# Patient Record
Sex: Male | Born: 2005 | Hispanic: No | Marital: Single
Health system: Southern US, Community
[De-identification: ages and names within clinical notes are randomized; demographics above are authoritative.]

## PROBLEM LIST (undated history)

## (undated) DIAGNOSIS — F840 Autistic disorder: Secondary | ICD-10-CM

## (undated) DIAGNOSIS — R569 Unspecified convulsions: Secondary | ICD-10-CM

## (undated) DIAGNOSIS — Q8789 Other specified congenital malformation syndromes, not elsewhere classified: Secondary | ICD-10-CM

## (undated) DIAGNOSIS — H02409 Unspecified ptosis of unspecified eyelid: Secondary | ICD-10-CM

## (undated) DIAGNOSIS — R011 Cardiac murmur, unspecified: Secondary | ICD-10-CM

## (undated) DIAGNOSIS — M9112 Juvenile osteochondrosis of head of femur [Legg-Calve-Perthes], left leg: Secondary | ICD-10-CM

## (undated) HISTORY — PX: LOBECTOMY: SHX5089

## (undated) HISTORY — PX: TYMPANOSTOMY TUBE PLACEMENT: SHX32

## (undated) HISTORY — DX: Other specified congenital malformation syndromes, not elsewhere classified: Q87.89

## (undated) HISTORY — DX: Unspecified ptosis of unspecified eyelid: H02.409

## (undated) HISTORY — PX: TONSILLECTOMY AND ADENOIDECTOMY: SUR1326

## (undated) HISTORY — PX: PTOSIS REPAIR: SHX6568

## (undated) HISTORY — DX: Cardiac murmur, unspecified: R01.1

## (undated) HISTORY — DX: Juvenile osteochondrosis of head of femur (legg-calve-perthes), left leg: M91.12

---
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
## 2024-06-28 ENCOUNTER — Ambulatory Visit: Payer: Self-pay

## 2024-06-28 NOTE — Telephone Encounter (Signed)
   Copied from CRM #[id number]. Topic: Clinical - Red Word Triage >> Jun 28, 2024 12:10 PM Harlene ORN wrote: Red Word that prompted transfer to Nurse Triage: constantly bleeding out of his mouth everynight since November 3rd this year. Was happening at his previous group home as well. Has lost over 60 pounds prior due to the pain in his mouth. Reason for Disposition  Bleeding from mouth is a chronic symptom (recurrent or ongoing AND present > 4 weeks)  Answer Assessment - Initial Assessment Questions New to residential care home[facility]. Moved in November 3rd.   Had been in group home prior to residential. Bleeding gums each night since he has been their, this continued from group home. Odella brought him to dentist but they were unable to work on him without  sedation. Need Referral for dentist at [facility] for sedation.   Group home reported to Ms. Monica that [facility] and needs finger foods, Ms. Odella has found that to not be the case he is using utensils with minimal assistance.   He has not had his boost since moving into residential care, social work is assisting with boost coverage.   Requesting to establish care with [facility]. 1st new patient appointment scheduled 08/13/24, added appointment to wait list.    1. SYMPTOM: What's the main symptom you're concerned about? (e.g., chapped lips, dry mouth, lump, sores)     Bleeding mouth 2. ONSET: When did the  bleeding start?     Several months ago 3. PAIN: Is there any pain? If Yes, ask: How bad is it? (Scale: 0-10; or none, mild, moderate, severe)     unknown 4. CAUSE: What do you think is causing the symptoms?     Dental issues  5. OTHER SYMPTOMS: Do you have any other symptoms? (e.g., fever, sore throat, toothache, swelling)     Bad breath, dry skin, weight lose 60lbs in one month prior to moving to residential care-he is eating and drinking well at residential care.  6. PREGNANCY: Is there any  chance you are pregnant? When was your last menstrual period?  Protocols used: Mouth Symptoms-A-AH

## 2024-07-05 ENCOUNTER — Other Ambulatory Visit: Payer: Self-pay

## 2024-07-05 ENCOUNTER — Encounter (HOSPITAL_COMMUNITY): Payer: Self-pay | Admitting: *Deleted

## 2024-07-05 ENCOUNTER — Emergency Department (HOSPITAL_COMMUNITY)
Admission: EM | Admit: 2024-07-05 | Discharge: 2024-07-05 | Disposition: A | Discharge status: Home / Self Care | Payer: MEDICAID | Attending: Emergency Medicine | Admitting: Emergency Medicine

## 2024-07-05 DIAGNOSIS — G40909 Epilepsy, unspecified, not intractable, without status epilepticus: Secondary | ICD-10-CM

## 2024-07-05 DIAGNOSIS — Z760 Encounter for issue of repeat prescription: Secondary | ICD-10-CM

## 2024-07-05 MED ORDER — CLOBAZAM 20 MG PO TABS: 20.0000 mg | ORAL_TABLET | Freq: Every evening | ORAL | 0 refills | Status: AC

## 2024-07-05 MED ORDER — OLOPATADINE HCL 0.2 % OP SOLN: 1.0000 [drp] | Freq: Every day | OPHTHALMIC | 0 refills | Status: AC

## 2024-07-05 MED ORDER — LEVETIRACETAM 100 MG/ML PO SOLN
1500.0000 mg | Freq: Once | ORAL | Status: AC
  Administered 2024-07-05: 1500 mg via ORAL
  Filled 2024-07-05: qty 15

## 2024-07-05 MED ORDER — LEVETIRACETAM 100 MG/ML PO SOLN: 1500.0000 mg | Freq: Two times a day (BID) | ORAL | 0 refills | Status: AC

## 2024-07-05 MED ORDER — ATOMOXETINE HCL 80 MG PO CAPS: 80.0000 mg | ORAL_CAPSULE | Freq: Every day | ORAL | 0 refills | Status: AC

## 2024-07-05 MED ORDER — CLOBAZAM 10 MG PO TABS
20.0000 mg | ORAL_TABLET | Freq: Once | ORAL | Status: AC
  Administered 2024-07-05: 20 mg via ORAL

## 2024-07-05 MED ORDER — CETIRIZINE HCL 10 MG PO TABS: 10.0000 mg | ORAL_TABLET | Freq: Every day | ORAL | 0 refills | Status: AC

## 2024-07-05 MED ORDER — CLOBAZAM 10 MG PO TABS: 15.0000 mg | ORAL_TABLET | Freq: Every morning | ORAL | 0 refills | Status: AC

## 2024-07-05 NOTE — ED Provider Notes (Signed)
 [name]   CSN: [id number] Arrival date & time: 07/05/24  1958     Patient presents with: seizure  Karl Owens is a 18 y.o. male.   The history is provided by a caregiver and medical records.   18 year old male with history of ADHD, seizure disorder, severe autism, brought in by caregiver for possible seizure activity.  Patient was relocated to [facility] group home from [place] [facility] .  Since moving here they have been unable to get him in with local neurology clinic.  They are having to drive him back to [facility] in just a few days to see his old neurologist.  Because of this, he has been without some of his medications for the past 3 to 4 days.  Today while at group activity center he had what appeared to be 2 episodes of seizure activity.  Activity director described this as some shaking of his right arm, staring off into space, not having period of lethargy afterwards.  There was no incontinence.  These are typical of his seizures per caregiver.  He is currently back to baseline here in the ER.  Caregiver is hopeful that we can refill his prescriptions enough until he can get in with his neurologist.  Prior to Admission medications   Not on File    Allergies: Patient has no known allergies.    Review of Systems  Unable to perform ROS: Patient nonverbal    Updated Vital Signs BP (!) 132/108   Pulse 93   SpO2 100%   Physical Exam Vitals and nursing note reviewed.  Constitutional:      Appearance: He is well-developed.     Comments: Pacing around room holding his shoe, baseline per caregiver  HENT:     Head: Normocephalic and atraumatic.     Mouth/Throat:     Comments: Difficult to fully visualize but not no apparent tongue or dental injury Eyes:     Conjunctiva/sclera: Conjunctivae normal.     Pupils: Pupils are equal, round, and reactive to light.  Cardiovascular:     Rate and Rhythm: Normal  rate and regular rhythm.     Heart sounds: Normal heart sounds.  Pulmonary:     Effort: Pulmonary effort is normal.     Breath sounds: Normal breath sounds.  Abdominal:     General: Bowel sounds are normal.     Palpations: Abdomen is soft.  Musculoskeletal:        General: Normal range of motion.     Cervical back: Normal range of motion.  Skin:    General: Skin is warm and dry.  Neurological:     Mental Status: He is alert.     Comments: Awake, alert, does follow most commands when prompted, moving extremities well without focal deficit  Psychiatric:     Comments: Unable to assess     (all labs ordered are listed, but only abnormal results are displayed) Labs Reviewed - No data to display  EKG: None  Radiology: No results found.   Procedures   Medications Ordered in the ED - No data to display                                  Medical Decision Making Risk OTC drugs. Prescription drug management.   18 year old male presenting to the ED after reported possible seizure x 2 today while at his  activity center.  Does have a history of same and recently relocated to group home in [place] so has been out of 2 of his seizure medicines for the past several days.  Has not been able to get in with local neurology office here in [place].  He is awake, alert, pacing around the room which is baseline for him.  He does follow most commands when prompted, moving extremities without focal deficit.  This is baseline per caregiver at bedside.  Did not have any incontinence today, no apparent tongue or dental injury, although difficult to fully examine due to patient cooperation.  He was given dose of his Onfi  and Keppra  for this evening, I have refilled his needed medications for the next 30 days as prescribed.  He is due to see his primary care doctor in just a few days and neurologist 2 days after that.  I have placed referral to local neurology office to hopefully get him in  there.  Can return here for new concerns.  Final diagnoses:  Seizure disorder [facility])  Medication refill    ED Discharge Orders          Ordered    atomoxetine  (STRATTERA ) 80 MG capsule  Daily        07/05/24 2237    cetirizine  (ZYRTEC ) 10 MG tablet  Daily        07/05/24 2237    cloBAZam  (ONFI ) 10 MG tablet  Every morning        07/05/24 2237    cloBAZam  (ONFI ) 20 MG tablet  Every evening        07/05/24 2237    levETIRAcetam  (KEPPRA ) 100 MG/ML solution  2 times daily        07/05/24 2237    Ambulatory referral to Neurology       Comments: An appointment is requested in approximately: 4 weeks   07/05/24 2237    Olopatadine  HCl 0.2 % SOLN  Daily        07/05/24 2239               Jarold Olam HERO, PA-C 07/06/24 0019    Freddi Hamilton, MD 07/12/24 [phone]

## 2024-07-05 NOTE — ED Notes (Signed)
 Patient was not able to be still enough for vitals.

## 2024-07-05 NOTE — ED Triage Notes (Signed)
 Patient bib caregiver after patient had a seizure about a hour ago. Caregiver states that she is unsure of how long te seizure lasted because he was out with someone else.

## 2024-07-05 NOTE — ED Triage Notes (Signed)
 Pt arrived with caregiver from group home. Recently transitioned to group home and has been out of some of his seizure meds for 3-4 days. Unsure if patient had a seizure today; he is currently acting at his baseline.   Caregiver reports she has a doctor appt for pt on Friday near [place] where pt is from and a neurology appt on Monday.

## 2024-07-05 NOTE — Discharge Instructions (Signed)
 I have refilled needed medications for 30 days, continue as directed. I have placed a referral to local neurology clinic to see if we can get him in there.  They should be contacting you with an appt.  I have also attached office information. Return here or new concerns.

## 2024-07-05 NOTE — ED Triage Notes (Signed)
 Patient is not allowing staff to get his vital signs at this time. NT notified RN.

## 2024-07-07 ENCOUNTER — Telehealth: Payer: Self-pay

## 2024-07-07 NOTE — Telephone Encounter (Signed)
 Received vm regarding patient's medication scripts for clobazam. Returned call, morning dose was only written for 30 pills which would be a 20 day supply, not 30. Pharmacy to fill for 45 pills to complete the 30 day supply.   Merilee Batty, MSN, RN Case Management [phone]

## 2024-08-13 ENCOUNTER — Encounter: Payer: Self-pay | Admitting: General Practice

## 2024-08-13 ENCOUNTER — Ambulatory Visit: Payer: MEDICAID | Admitting: General Practice

## 2024-08-13 VITALS — Temp 97.3°F | Ht 64.0 in | Wt 103.6 lb

## 2024-08-13 DIAGNOSIS — H5052 Exophoria: Secondary | ICD-10-CM | POA: Diagnosis not present

## 2024-08-13 DIAGNOSIS — F849 Pervasive developmental disorder, unspecified: Secondary | ICD-10-CM

## 2024-08-13 DIAGNOSIS — G40209 Localization-related (focal) (partial) symptomatic epilepsy and epileptic syndromes with complex partial seizures, not intractable, without status epilepticus: Secondary | ICD-10-CM | POA: Diagnosis not present

## 2024-08-13 DIAGNOSIS — Z0000 Encounter for general adult medical examination without abnormal findings: Secondary | ICD-10-CM | POA: Diagnosis not present

## 2024-08-13 DIAGNOSIS — F79 Unspecified intellectual disabilities: Secondary | ICD-10-CM | POA: Diagnosis not present

## 2024-08-13 DIAGNOSIS — Z7689 Persons encountering health services in other specified circumstances: Secondary | ICD-10-CM | POA: Insufficient documentation

## 2024-08-13 DIAGNOSIS — Z23 Encounter for immunization: Secondary | ICD-10-CM

## 2024-08-13 DIAGNOSIS — F902 Attention-deficit hyperactivity disorder, combined type: Secondary | ICD-10-CM | POA: Diagnosis not present

## 2024-08-13 NOTE — Progress Notes (Signed)
 "  New Patient Office Visit  Subjective    Patient ID: Karl Owens, male    DOB: 09-Jul-2006  Age: 19 y.o. MRN: [id number]  CC:  Chief Complaint  Patient presents with   New Patient (Initial Visit)    Establish care    HPI Karl Owens is a 19 y.o. male presents to establish care, complete physical and follow up of chronic conditions. Odella, Caretaker is also present today.  [facility] is the guardian.   Discussed the use of AI scribe software for clinical note transcription with the patient, who gave verbal consent to proceed.  History of Present Illness Karl Owens is a 19 year old male with epilepsy and ADHD who presents for establishment of care and medication management. He is accompanied by Odella, his caregiver from [facility].  He has a history of epilepsy with partial seizures and is currently under the care of a neurologist at [facility] in [place]. Due to the distance of the current provider, he is seeking a local neurologist. He is currently taking Clobazam  and Keppra  and requires ongoing management and prescription refills.  He also has a diagnosis of ADHD, previously managed by [facility] through a group home. He is currently on Strattera  for ADHD management. There is a need to establish care with a local psychiatrist for continued management of his ADHD and medication refills.  He is nonverbal and resides in a residential facility. His daily routine includes attending school and participating in community activities. He tends to go to his room and lie down, which may be a habit from his previous group home environment. He is described as making progress and being 'very smart'.  He has a history of dental issues, with bleeding from the mouth noted in the mornings. He has been referred to [facility] for dental care under sedation due to difficulties with previous dental visits.  His current medications include Zyrtec ,  vitamin D, Clobazam , Clonazepam, and Guanfacine, which are managed by his specialists.   He has regular bowel movements every other day. His nutritional intake is adequate, and he is able to eat well.   Immunizations: -Tetanus: Completed in -Influenza:  -Shingles: Completed Shingrix series -Pneumonia: Completed   Diet: Fair diet.  Exercise: No regular exercise.  Eye exam: Completes annually  Dental exam: Completes semi-annually    Colonoscopy: Completed in  Lung Cancer Screening: Completed in   PSA: Due     Outpatient Encounter Medications as of 08/13/2024  Medication Sig   atomoxetine  (STRATTERA ) 40 MG capsule Take 40 mg by mouth every morning.   cetirizine  (ZYRTEC ) 10 MG tablet Take 1 tablet (10 mg total) by mouth daily.   Cholecalciferol 1.25 MG (50000 UT) capsule Take 50,000 Units by mouth once a week.   cloBAZam  (ONFI ) 10 MG tablet Take 1.5 tablets (15 mg total) by mouth in the morning.   cloBAZam  (ONFI ) 20 MG tablet Take 1 tablet (20 mg total) by mouth every evening.   clonazePAM (KLONOPIN) 0.25 MG disintegrating tablet Take 0.25 mg by mouth.   diazePAM, 15 MG Dose, (VALTOCO 15 MG DOSE) 2 x 7.5 MG/0.1ML LQPK Place 7.5 mg into the nose.   guanFACINE (TENEX) 1 MG tablet Take 1 mg by mouth.   levETIRAcetam  (KEPPRA ) 100 MG/ML solution Take 15 mLs (1,500 mg total) by mouth 2 (two) times daily.   Olopatadine  HCl 0.2 % SOLN Apply 1 drop to eye daily.   traZODone (  DESYREL) 50 MG tablet Take 50 mg by mouth at bedtime.   zonisamide (ZONEGRAN) 100 MG capsule Take 400 mg by mouth.   atomoxetine  (STRATTERA ) 80 MG capsule Take 1 capsule (80 mg total) by mouth daily.   No facility-administered encounter medications on file as of 08/13/2024.    Past Medical History:  Diagnosis Date   Autism    Heart murmur    KBG syndrome    Legg-Calve-Perthes disease, left    Ptosis of eyelid    Seizures (HCC)     Past Surgical History:  Procedure Laterality Date   LOBECTOMY Right     PTOSIS REPAIR     TONSILLECTOMY AND ADENOIDECTOMY     TYMPANOSTOMY TUBE PLACEMENT      History reviewed. No pertinent family history.  Social History   Socioeconomic History   Marital status: Single    Spouse name: Not on file   Number of children: Not on file   Years of education: Not on file   Highest education level: Not on file  Occupational History   Not on file  Tobacco Use   Smoking status: Never   Smokeless tobacco: Never  Vaping Use   Vaping status: Never Used  Substance and Sexual Activity   Alcohol use: Never   Drug use: Never   Sexual activity: Never  Other Topics Concern   Not on file  Social History Narrative   Not on file   Social Drivers of Health   Tobacco Use: Low Risk (08/13/2024)   Patient History    Smoking Tobacco Use: Never    Smokeless Tobacco Use: Never    Passive Exposure: Not on file  Financial Resource Strain: Not on file  Food Insecurity: Not on file  Transportation Needs: Not on file  Physical Activity: Not on file  Stress: Not on file  Social Connections: Not on file  Intimate Partner Violence: Not on file  Depression (EYV7-0): Medium Risk (08/13/2024)   Depression (PHQ2-9)    PHQ-2 Score: 8  Alcohol Screen: Not on file  Housing: Not on file  Utilities: Not on file  Health Literacy: Not on file    Review of Systems  Constitutional:  Negative for chills and fever.  Respiratory:  Negative for shortness of breath.   Cardiovascular:  Negative for chest pain.  Gastrointestinal:  Negative for abdominal pain, constipation, diarrhea, heartburn, nausea and vomiting.  Genitourinary:  Negative for dysuria, frequency and urgency.  Neurological:  Negative for dizziness and headaches.  Endo/Heme/Allergies:  Negative for polydipsia.  Psychiatric/Behavioral:  Negative for depression and suicidal ideas. The patient is not nervous/anxious.         Objective    Temp (!) 97.3 F (36.3 C) (Temporal)   Ht 5' 4 (1.626 m)   Wt 103 lb 9.6 oz  (47 kg)   BMI 17.78 kg/m    Physical Exam Vitals and nursing note reviewed.  Constitutional:      Appearance: Normal appearance.  HENT:     Right Ear: There is impacted cerumen.     Left Ear: There is impacted cerumen.     Nose: Nose normal.     Mouth/Throat:     Mouth: Mucous membranes are moist.     Dentition: Abnormal dentition.     Pharynx: Oropharynx is clear.  Eyes:     Conjunctiva/sclera: Conjunctivae normal.     Pupils: Pupils are equal, round, and reactive to light.  Cardiovascular:     Rate and Rhythm: Normal rate and  regular rhythm.     Pulses: Normal pulses.     Heart sounds: Normal heart sounds.  Pulmonary:     Effort: Pulmonary effort is normal.     Breath sounds: Normal breath sounds.  Abdominal:     General: Abdomen is flat. Bowel sounds are normal.     Palpations: Abdomen is soft.  Musculoskeletal:        General: Normal range of motion.     Cervical back: Normal range of motion.  Skin:    General: Skin is warm and dry.  Neurological:     Mental Status: He is alert. Mental status is at baseline.     Comments: Per caregiver, patient is at baseline.  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     Attention deficit hyperactivity disorder (ADHD), combined type -     Ambulatory referral to Psychiatry  Atypical autism -     Ambulatory referral to Neurology -     Ambulatory referral to Psychiatry  Intellectual disability -     Ambulatory referral to Psychiatry  Exophoria -     Ambulatory referral to Psychiatry  Encounter for immunization -     Flu vaccine trivalent PF, 6mos and older(Flulaval,Afluria,Fluarix,Fluzone)  Encounter for screening and preventative care Assessment & Plan: Immunizations flu  shot given today; tdap UTD.  Discussed the importance of a healthy diet and regular exercise in order for weight loss, and to reduce the risk of further co-morbidity.  Exam stable. Reviewed labs in care everywhere.  Follow up in 1 year for repeat physical.      Assessment and Plan Assessment & Plan Localization-related focal epilepsy with complex partial seizures Diagnosed with localization-related focal epilepsy with complex partial seizures. Managed by neurology in [place]. Seeking new neurologist in [place] due to distance. - Submitted urgent referral to neurology in [employer]. - Continue current medications through existing neurology provider.   Attention-deficit hyperactivity disorder, combined type Diagnosed with ADHD, combined type. Managed by psychiatry through [place]. On Strattera . Requires ongoing psychiatric management. - referral placed to psychiatry in [place]. - Continue Strattera   per psychiatry.  Atypical autism Requires ongoing psychiatric management. - psych referral placed.  Intellectual disability Diagnosed with intellectual disability.   Exophoria Diagnosed with exophoria.   Impacted cerumen Presence of impacted cerumen in ears.  - unable to sit still to perform ear irrigation today; recommended debrox drops for home.  - if unsuccessful, will refer to ENT.  General health maintenance Routine health maintenance discussed, including vaccinations and dental care. Recent dental visit unsuccessful due to inability to manage in a chair, requiring sedation at [facility]. - Administered flu shot. - Recommended dental care with sedation at [facility].  Return in about 6 months (around 02/10/2025) for chronic care management.SABRA Carrol [place], NP  "

## 2024-08-13 NOTE — Assessment & Plan Note (Signed)
 EMR reviewed briefly.

## 2024-08-13 NOTE — Patient Instructions (Signed)
 You will either be contacted via phone regarding your referral to neurology and psychiatry, or you may receive a letter on your MyChart portal from our referral team with instructions for scheduling an appointment. Please let us  know if you have not been contacted by anyone within two weeks.   Use debrox drops to clean out the ears.  If you are not successful; let me know and I can do an ENT referral as well.   Follow up in 6 months for chronic care management.   It was a pleasure to meet you today! Please don't hesitate to contact me with any questions. Welcome to [facility]!

## 2024-08-13 NOTE — Assessment & Plan Note (Addendum)
 Immunizations flu shot given today; tdap UTD.  Discussed the importance of a healthy diet and regular exercise in order for weight loss, and to reduce the risk of further co-morbidity.  Exam stable. Reviewed labs in care everywhere.  Follow up in 1 year for repeat physical.

## 2024-08-17 ENCOUNTER — Telehealth: Payer: Self-pay | Admitting: General Practice

## 2024-08-17 NOTE — Telephone Encounter (Signed)
 Copied from CRM #[id number]. Topic: Referral - Question >> Aug 17, 2024  9:59 AM [name] wrote: Reason for CRM: Karl Owens needs to be referred over to [facility] , will not accept Karl Owens because he is nonverbal    [facility] Address: [address] [place], [place] [address] Phone: [phone] Fax: [phone]

## 2024-08-17 NOTE — Telephone Encounter (Signed)
 Called [place] back as message is confusing. Need to see where referral needs to go.

## 2024-08-18 NOTE — Telephone Encounter (Signed)
 Message left on [facility] VM that the referral has been changed as requested.

## 2024-08-18 NOTE — Telephone Encounter (Signed)
 Pt mother returned phone call to [place]. I gave her the referral information and she should be set. I did notice that the name of the clinic does not say [facility] and Im not sure if that will make a difference.

## 2024-08-18 NOTE — Telephone Encounter (Signed)
 Can referral location be changed to below office?

## 2024-08-18 NOTE — Telephone Encounter (Signed)
 Copied from CRM [id number]. Topic: General - Other >> Aug 17, 2024  3:53 PM Deleta RAMAN wrote: Reason for CRM: Referral needs to go to [facility] Telephone number [phone]

## 2024-11-16 ENCOUNTER — Ambulatory Visit: Admitting: Neurology
